# Patient Record
Sex: Male | Born: 1995 | Race: Black or African American | Hispanic: No | Marital: Single | Smoking: Never smoker
Health system: Southern US, Community
[De-identification: ages and names within clinical notes are randomized; demographics above are authoritative.]

## PROBLEM LIST (undated history)

## (undated) HISTORY — PX: CLAVICLE SURGERY: SHX598

---
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
## 2019-03-10 ENCOUNTER — Other Ambulatory Visit: Payer: Self-pay

## 2019-03-10 ENCOUNTER — Emergency Department: Payer: Self-pay

## 2019-03-10 ENCOUNTER — Emergency Department
Admission: EM | Admit: 2019-03-10 | Discharge: 2019-03-10 | Disposition: A | Discharge status: Home / Self Care | Payer: Self-pay | Attending: Emergency Medicine | Admitting: Emergency Medicine

## 2019-03-10 ENCOUNTER — Encounter: Payer: Self-pay | Admitting: Emergency Medicine

## 2019-03-10 DIAGNOSIS — S62616A Displaced fracture of proximal phalanx of right little finger, initial encounter for closed fracture: Secondary | ICD-10-CM | POA: Insufficient documentation

## 2019-03-10 DIAGNOSIS — W51XXXA Accidental striking against or bumped into by another person, initial encounter: Secondary | ICD-10-CM | POA: Insufficient documentation

## 2019-03-10 DIAGNOSIS — Y92310 Basketball court as the place of occurrence of the external cause: Secondary | ICD-10-CM | POA: Insufficient documentation

## 2019-03-10 DIAGNOSIS — Y998 Other external cause status: Secondary | ICD-10-CM | POA: Insufficient documentation

## 2019-03-10 DIAGNOSIS — Y9367 Activity, basketball: Secondary | ICD-10-CM | POA: Insufficient documentation

## 2019-03-10 MED ORDER — MELOXICAM 15 MG PO TABS: 15.0000 mg | ORAL_TABLET | Freq: Every day | ORAL | 0 refills | Status: DC

## 2019-03-10 NOTE — ED Triage Notes (Signed)
Patient presents to the ED with obvious deformity to her right pinky.  Patient states he injured his finger 30 min. Ago while playing basketball.  Patient denies pain at this time.  Reports numbness to finger.

## 2019-03-10 NOTE — ED Provider Notes (Signed)
[facility] Emergency Department Provider Note  ____________________________________________  Time seen: Approximately 8:15 PM  I have reviewed the triage vital signs and the nursing notes.   HISTORY  Chief Complaint Finger Injury    HPI Mitchell Mcdaniel is a 23 y.o. male who presents the emergency department complaining of injury to the pinky finger of the right hand.  Patient was playing basketball when he and another player reached for the same ball, making contact with his pinky.  Patient reports that he had a sudden sharp pain and noticed that his finger was deformed.  Patient thought he may have dislocated the digit and presents to the emergency department for evaluation.  He reports that while not moving the finger he has very little pain.  No other injury or complaint.  No medications prior to arrival.         History reviewed. No pertinent past medical history.  There are no active problems to display for this patient.   Past Surgical History:  Procedure Laterality Date  . CLAVICLE SURGERY      Prior to Admission medications   Medication Sig Start Date End Date Taking? Authorizing Provider  ibuprofen (ADVIL,MOTRIN) 600 MG tablet Take 1 tablet (600 mg total) by mouth 3 (three) times daily. 04/08/15   Tommi Rumps, PA-C  meloxicam (MOBIC) 15 MG tablet Take 1 tablet (15 mg total) by mouth daily. 03/10/19   Cuthriell, Delorise Royals, PA-C  naproxen (NAPROSYN) 500 MG tablet Take 1 tablet (500 mg total) by mouth 2 (two) times daily with a meal. 01/25/16   Joni Reining, PA-C    Allergies Patient has no known allergies.  No family history on file.  Social History Social History   Tobacco Use  . Smoking status: Never Smoker  . Smokeless tobacco: Never Used  Substance Use Topics  . Alcohol use: No  . Drug use: Not on file     Review of Systems  Constitutional: No fever/chills Eyes: No visual changes. No discharge ENT: No upper respiratory  complaints. Cardiovascular: no chest pain. Respiratory: no cough. No SOB. Gastrointestinal: No abdominal pain.  No nausea, no vomiting.  No diarrhea.  No constipation. Musculoskeletal: Injury to the pinky finger of the right hand Skin: Negative for rash, abrasions, lacerations, ecchymosis. Neurological: Negative for headaches, focal weakness or numbness. 10-point ROS otherwise negative.  ____________________________________________   PHYSICAL EXAM:  VITAL SIGNS: ED Triage Vitals [03/10/19 1906]  Enc Vitals Group     BP      Pulse Rate 76     Resp 16     Temp 98.8 F (37.1 C)     Temp Source Oral     SpO2 97 %     Weight 145 lb (65.8 kg)     Height 6' (1.829 m)     Head Circumference      Peak Flow      Pain Score 10     Pain Loc      Pain Edu?      Excl. in GC?      Constitutional: Alert and oriented. Well appearing and in no acute distress. Eyes: Conjunctivae are normal. PERRL. EOMI. Head: Atraumatic. ENT:      Ears:       Nose: No congestion/rhinnorhea.      Mouth/Throat: Mucous membranes are moist.  Neck: No stridor.    Cardiovascular: Normal rate, regular rhythm. Normal S1 and S2.  Good peripheral circulation. Respiratory: Normal respiratory effort without tachypnea  or retractions. Lungs CTAB. Good air entry to the bases with no decreased or absent breath sounds. Musculoskeletal: Full range of motion to all extremities.  Visualization of the fifth digit right hand shows angulation medially.  Skin intact over the fifth digit.  Patient has limited range of motion to the digit but is able to flex and extend the distal phalanx appropriately.  Sensation and capillary refill intact. Neurologic:  Normal speech and language. No gross focal neurologic deficits are appreciated.  Skin:  Skin is warm, dry and intact. No rash noted. Psychiatric: Mood and affect are normal. Speech and behavior are normal. Patient exhibits appropriate insight and  judgement.   ____________________________________________   LABS (all labs ordered are listed, but only abnormal results are displayed)  Labs Reviewed - No data to display ____________________________________________  EKG   ____________________________________________  RADIOLOGY I personally viewed and evaluated these images as part of my medical decision making, as well as reviewing the written report by the radiologist.  Dg Finger Little Right  Result Date: 03/10/2019 CLINICAL DATA:  Injured finger playing basketball EXAM: RIGHT LITTLE FINGER 2+V COMPARISON:  None. FINDINGS: There is an acute obliquely oriented fracture involving the midshaft of the proximal phalanx of the fifth digit with minimal foreshortening but without definitive intra-articular extension. No dislocation. Expected adjacent soft tissue swelling. No radiopaque foreign body. IMPRESSION: Acute, obliquely oriented minimally displaced fracture involving the midshaft of the proximal phalanx of the fifth digit without definitive intra-articular extension. Electronically Signed   By: Sandi Mariscal M.D.   On: 03/10/2019 19:31    ____________________________________________    PROCEDURES  Procedure(s) performed:    .Splint Application  Date/Time: 03/10/2019 8:23 PM Performed by: Darletta Moll, PA-C Authorized by: Darletta Moll, PA-C   Consent:    Consent obtained:  Verbal   Consent given by:  Patient   Risks discussed:  Pain and swelling Pre-procedure details:    Sensation:  Normal Procedure details:    Laterality:  Right   Location:  Finger   Finger:  R small finger   Splint type:  Finger   Supplies:  Aluminum splint and elastic bandage Post-procedure details:    Pain:  Improved   Sensation:  Normal   Patient tolerance of procedure:  Tolerated well, no immediate complications      Medications - No data to display   ____________________________________________   INITIAL  IMPRESSION / ASSESSMENT AND PLAN / ED COURSE  Pertinent labs & imaging results that were available during my care of the patient were reviewed by me and considered in my medical decision making (see chart for details).  Review of the [place] CSRS was performed in accordance of the [place] prior to dispensing any controlled drugs.           Patient's diagnosis is consistent with fracture of the proximal phalanx fifth digit right hand.  Patient presented to the emergency department with an injury to the fifth digit after playing basketball.  Patient had an angulated finger on exam.  Imaging reveals fracture of the proximal phalanx.  Finger is buddy taped to the fourth digit, splint applied.  Follow-up with hand surgery..  Patient is given ED precautions to return to the ED for any worsening or new symptoms.     ____________________________________________  FINAL CLINICAL IMPRESSION(S) / ED DIAGNOSES  Final diagnoses:  Closed displaced fracture of proximal phalanx of right little finger, initial encounter      NEW MEDICATIONS STARTED DURING THIS VISIT:  ED  Discharge Orders         Ordered    meloxicam (MOBIC) 15 MG tablet  Daily     03/10/19 2022              This chart was dictated using voice recognition software/Dragon. Despite best efforts to proofread, errors can occur which can change the meaning. Any change was purely unintentional.    Racheal PatchesCuthriell, Jonathan D, PA-C 03/10/19 2027    Jene EveryKinner, Robert, MD 03/10/19 2258

## 2020-07-02 ENCOUNTER — Other Ambulatory Visit: Payer: Self-pay

## 2020-07-02 ENCOUNTER — Emergency Department: Payer: No Typology Code available for payment source

## 2020-07-02 ENCOUNTER — Emergency Department
Admission: EM | Admit: 2020-07-02 | Discharge: 2020-07-02 | Disposition: A | Discharge status: Home / Self Care | Payer: No Typology Code available for payment source | Attending: Emergency Medicine | Admitting: Emergency Medicine

## 2020-07-02 DIAGNOSIS — M25511 Pain in right shoulder: Secondary | ICD-10-CM | POA: Insufficient documentation

## 2020-07-02 DIAGNOSIS — R519 Headache, unspecified: Secondary | ICD-10-CM | POA: Diagnosis not present

## 2020-07-02 DIAGNOSIS — M542 Cervicalgia: Secondary | ICD-10-CM | POA: Diagnosis not present

## 2020-07-02 DIAGNOSIS — Y92410 Unspecified street and highway as the place of occurrence of the external cause: Secondary | ICD-10-CM | POA: Diagnosis not present

## 2020-07-02 DIAGNOSIS — S0990XA Unspecified injury of head, initial encounter: Secondary | ICD-10-CM | POA: Diagnosis present

## 2020-07-02 DIAGNOSIS — R55 Syncope and collapse: Secondary | ICD-10-CM | POA: Insufficient documentation

## 2020-07-02 MED ORDER — METHOCARBAMOL 500 MG PO TABS: 500.0000 mg | ORAL_TABLET | Freq: Three times a day (TID) | ORAL | 0 refills | Status: AC | PRN

## 2020-07-02 MED ORDER — MELOXICAM 15 MG PO TABS: 15.0000 mg | ORAL_TABLET | Freq: Every day | ORAL | 2 refills | Status: AC
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
## 2020-07-02 NOTE — Discharge Instructions (Signed)
Take Meloxicam and Robaxin for pain and inflammation.

## 2021-05-20 ENCOUNTER — Encounter (HOSPITAL_COMMUNITY): Payer: Self-pay

## 2021-05-20 ENCOUNTER — Emergency Department (HOSPITAL_COMMUNITY): Payer: Self-pay

## 2021-05-20 ENCOUNTER — Emergency Department (HOSPITAL_COMMUNITY)
Admission: EM | Admit: 2021-05-20 | Discharge: 2021-05-21 | Disposition: A | Discharge status: Home / Self Care | Payer: Self-pay | Attending: Emergency Medicine | Admitting: Emergency Medicine

## 2021-05-20 ENCOUNTER — Other Ambulatory Visit: Payer: Self-pay

## 2021-05-20 DIAGNOSIS — Z79899 Other long term (current) drug therapy: Secondary | ICD-10-CM | POA: Insufficient documentation

## 2021-05-20 DIAGNOSIS — S0011XA Contusion of right eyelid and periocular area, initial encounter: Secondary | ICD-10-CM | POA: Insufficient documentation

## 2021-05-20 DIAGNOSIS — S02642A Fracture of ramus of left mandible, initial encounter for closed fracture: Secondary | ICD-10-CM | POA: Insufficient documentation

## 2021-05-20 DIAGNOSIS — S80811A Abrasion, right lower leg, initial encounter: Secondary | ICD-10-CM | POA: Insufficient documentation

## 2021-05-20 DIAGNOSIS — Y903 Blood alcohol level of 60-79 mg/100 ml: Secondary | ICD-10-CM | POA: Insufficient documentation

## 2021-05-20 DIAGNOSIS — S20319A Abrasion of unspecified front wall of thorax, initial encounter: Secondary | ICD-10-CM | POA: Insufficient documentation

## 2021-05-20 LAB — BASIC METABOLIC PANEL
Anion gap: 26 — ABNORMAL HIGH (ref 5–15)
BUN: 16 mg/dL (ref 6–20)
CO2: 10 mmol/L — ABNORMAL LOW (ref 22–32)
Calcium: 9.5 mg/dL (ref 8.9–10.3)
Chloride: 101 mmol/L (ref 98–111)
Creatinine, Ser: 1.46 mg/dL — ABNORMAL HIGH (ref 0.61–1.24)
GFR, Estimated: >60 mL/min (ref >60)
Glucose, Bld: 134 mg/dL — ABNORMAL HIGH (ref 70–99)
Potassium: 3.8 mmol/L (ref 3.5–5.1)
Sodium: 137 mmol/L (ref 135–145)

## 2021-05-20 LAB — ETHANOL: Alcohol, Ethyl (B): 74 mg/dL — ABNORMAL HIGH (ref <10)

## 2021-05-20 LAB — CBC WITH DIFFERENTIAL/PLATELET
Abs Immature Granulocytes: 0.07 10*3/uL (ref 0.00–0.07)
Basophils Absolute: 0.1 10*3/uL (ref 0.0–0.1)
Basophils Relative: 1 %
Eosinophils Absolute: 0.1 10*3/uL (ref 0.0–0.5)
Eosinophils Relative: 1 %
HCT: 50.3 % (ref 39.0–52.0)
Hemoglobin: 15.5 g/dL (ref 13.0–17.0)
Immature Granulocytes: 1 %
Lymphocytes Relative: 43 %
Lymphs Abs: 3.9 10*3/uL (ref 0.7–4.0)
MCH: 28.7 pg (ref 26.0–34.0)
MCHC: 30.8 g/dL (ref 30.0–36.0)
MCV: 93 fL (ref 80.0–100.0)
Monocytes Absolute: 0.9 10*3/uL (ref 0.1–1.0)
Monocytes Relative: 10 %
Neutro Abs: 3.9 10*3/uL (ref 1.7–7.7)
Neutrophils Relative %: 44 %
Platelets: 213 10*3/uL (ref 150–400)
RBC: 5.41 MIL/uL (ref 4.22–5.81)
RDW: 13.3 % (ref 11.5–15.5)
WBC: 8.9 10*3/uL (ref 4.0–10.5)
nRBC: 0 % (ref 0.0–0.2)

## 2021-05-20 LAB — TYPE AND SCREEN
ABO/RH(D): O POS
Antibody Screen: NEGATIVE

## 2021-05-20 NOTE — ED Notes (Signed)
Pt belongings placed in brown paper bag. Paper bag contains pants, shoes x2, socks x2, underwear, cell phone.
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
## 2021-05-20 NOTE — ED Notes (Signed)
Transported to CT 
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
## 2021-05-21 ENCOUNTER — Emergency Department (HOSPITAL_COMMUNITY): Payer: Self-pay
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
## 2021-05-21 MED ORDER — ONDANSETRON HCL 4 MG/2ML IJ SOLN
4.0000 mg | Freq: Once | INTRAMUSCULAR | Status: AC
  Administered 2021-05-21: 4 mg via INTRAVENOUS
  Filled 2021-05-21: qty 2

## 2021-05-21 MED ORDER — HYDROCODONE-ACETAMINOPHEN 5-325 MG PO TABS: 1.0000 | ORAL_TABLET | ORAL | 0 refills | Status: AC | PRN

## 2021-05-21 MED ORDER — MORPHINE SULFATE (PF) 4 MG/ML IV SOLN
4.0000 mg | Freq: Once | INTRAVENOUS | Status: AC
  Administered 2021-05-21: 4 mg via INTRAVENOUS
  Filled 2021-05-21: qty 1
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
